# Patient Record
Sex: Male | Born: 2000 | Race: Black or African American | Hispanic: No | Marital: Single | Smoking: Never smoker
Health system: Southern US, Community
[De-identification: ages and names within clinical notes are randomized; demographics above are authoritative.]

---
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
## 2019-08-07 IMAGING — CR DG CHEST 1V
1 series · 1 of 1 positions shown · non-contrast
Comparison: None.

CLINICAL DATA: Positive PPD

EXAM:
CHEST 1 VIEW

[w chest pa]
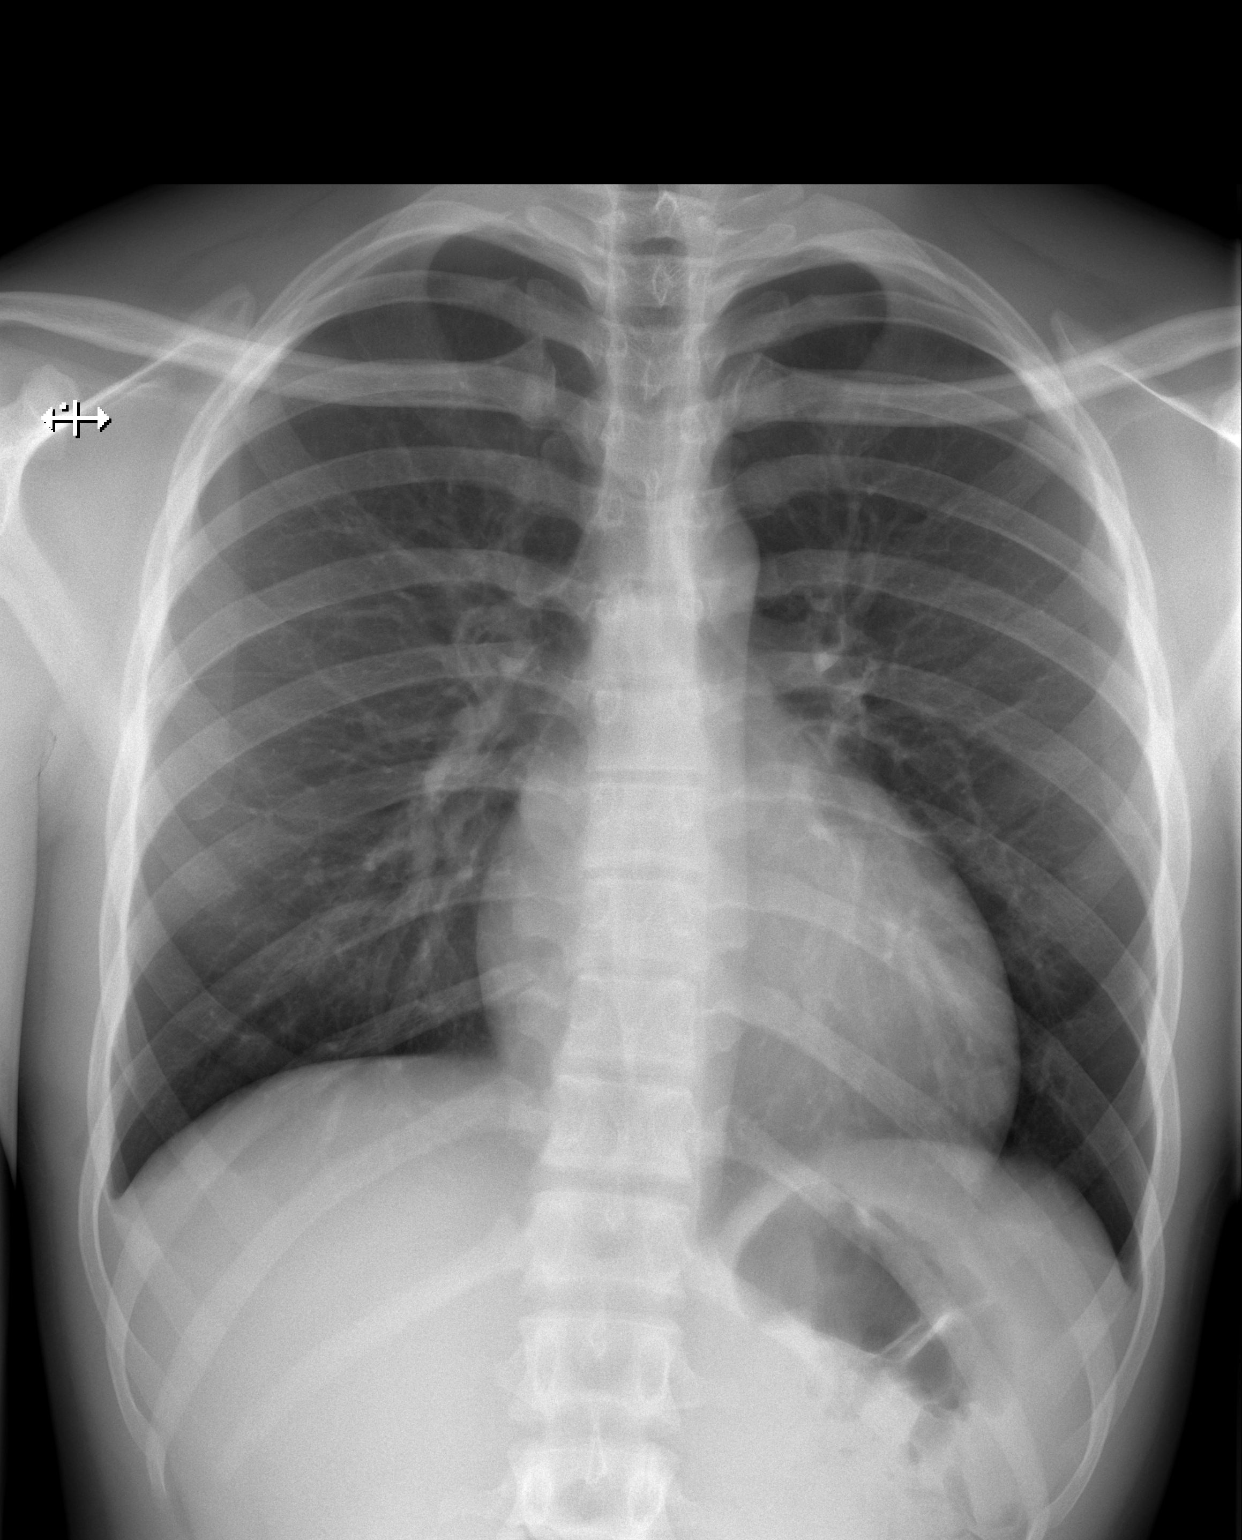

[1 of 1 positions shown; findings below may reference images not displayed]

FINDINGS: The heart size and mediastinal contours are within normal limits.
Both lungs are clear. The visualized skeletal structures are
unremarkable.
IMPRESSION: No active disease.

## 2020-05-06 ENCOUNTER — Ambulatory Visit: Payer: Managed Care, Other (non HMO) | Attending: Internal Medicine

## 2020-05-06 DIAGNOSIS — Z23 Encounter for immunization: Secondary | ICD-10-CM

## 2020-05-06 NOTE — Progress Notes (Signed)
° °  Covid-19 Vaccination Clinic  Name:  Keith Paul    MRN: [id number] DOB: 08-16-2001  05/06/2020  [name] was observed post Covid-19 immunization for 15 minutes without incident. He was provided with Vaccine Information Sheet and instruction to access the V-Safe system.   [name] was instructed to call 911 with any severe reactions post vaccine:  Difficulty breathing   Swelling of face and throat   A fast heartbeat   A bad rash all over body   Dizziness and weakness   Immunizations Administered    Name Date Dose VIS Date Route   Pfizer COVID-19 Vaccine 05/06/2020 11:52 AM 0.3 mL 01/26/2019 Intramuscular   Manufacturer: [employer], [employer]   Lot: [id number]   NDC: [address]-2

## 2020-05-27 ENCOUNTER — Ambulatory Visit: Payer: Managed Care, Other (non HMO) | Attending: Internal Medicine

## 2020-05-27 DIAGNOSIS — Z23 Encounter for immunization: Secondary | ICD-10-CM

## 2020-05-27 NOTE — Progress Notes (Signed)
   Covid-19 Vaccination Clinic  Name:  Merced Hanners    MRN: [id number] DOB: 01/03/2001  05/27/2020  Mr. Orvis was observed post Covid-19 immunization for 15 minutes without incident. He was provided with Vaccine Information Sheet and instruction to access the V-Safe system.   Mr. Revard was instructed to call 911 with any severe reactions post vaccine: Marland Kitchen Difficulty breathing  . Swelling of face and throat  . A fast heartbeat  . A bad rash all over body  . Dizziness and weakness   Immunizations Administered    Name Date Dose VIS Date Route   Pfizer COVID-19 Vaccine 05/27/2020  9:25 AM 0.3 mL 01/26/2019 Intramuscular   Manufacturer: [employer], [employer]   Lot: [id number]   NDC: [address]-7

## 2020-05-29 ENCOUNTER — Ambulatory Visit: Payer: Managed Care, Other (non HMO)

## 2021-08-10 ENCOUNTER — Other Ambulatory Visit: Payer: Self-pay

## 2021-08-10 ENCOUNTER — Ambulatory Visit
Admission: EM | Admit: 2021-08-10 | Discharge: 2021-08-10 | Disposition: A | Discharge status: Home / Self Care | Payer: Medicaid Other | Attending: Emergency Medicine | Admitting: Emergency Medicine

## 2021-08-10 DIAGNOSIS — H6692 Otitis media, unspecified, left ear: Secondary | ICD-10-CM

## 2021-08-10 MED ORDER — AMOXICILLIN 875 MG PO TABS: 875.0000 mg | ORAL_TABLET | Freq: Two times a day (BID) | ORAL | 0 refills | Status: AC

## 2021-08-10 NOTE — Discharge Instructions (Addendum)
Take the amoxicillin as directed.  Follow up with your primary care provider if your symptoms are not improving.   ° ° °

## 2021-08-10 NOTE — ED Triage Notes (Signed)
Pt presents with left ear pain for past 4 days

## 2021-08-10 NOTE — ED Provider Notes (Signed)
[facility]    CSN: [id number] Arrival date & time: 08/10/21  0813      History   Chief Complaint Chief Complaint  Patient presents with   Otalgia    HPI Keith Paul is a 20 y.o. male.  Patient presents with 4-day history of left ear pain.  He also reports muffled hearing.  Treatment attempted at home with OTC earwax kit without relief; ibuprofen taken yesterday.  He denies fever, chills, ear drainage, sore throat, cough, or other symptoms.  His medical history includes malaria, acute renal failure, thrombocytopenia.  The history is provided by the patient and medical records.   History reviewed. No pertinent past medical history.  Patient Active Problem List   Diagnosis Date Noted   Malaria 06/26/2018   ARF (acute renal failure) ([facility]) 06/26/2018   Thrombocytopenia ([facility]) 06/26/2018    History reviewed. No pertinent surgical history.     Home Medications    Prior to Admission medications   Medication Sig Start Date End Date Taking? Authorizing Provider  amoxicillin (AMOXIL) 875 MG tablet Take 1 tablet (875 mg total) by mouth 2 (two) times daily for 7 days. 08/10/21 08/17/21 Yes Sharion Balloon, NP  ibuprofen (ADVIL,MOTRIN) 200 MG tablet Take 400 mg by mouth every 6 (six) hours as needed for headache.    [provider]    Family History Family History  Problem Relation Age of Onset   Hypertension Father     Social History Social History   Tobacco Use   Smoking status: Never   Smokeless tobacco: Never  Substance Use Topics   Alcohol use: Never   Drug use: Never     Allergies   Patient has no known allergies.   Review of Systems Review of Systems  Constitutional:  Negative for chills and fever.  HENT:  Positive for ear pain and hearing loss. Negative for ear discharge and sore throat.   Respiratory:  Negative for cough and shortness of breath.   Cardiovascular:  Negative for chest pain and palpitations.  Gastrointestinal:  Negative  for abdominal pain and vomiting.  Skin:  Negative for color change and rash.  Neurological:  Negative for syncope.  All other systems reviewed and are negative.   Physical Exam Triage Vital Signs ED Triage Vitals  Enc Vitals Group     BP      Pulse      Resp      Temp      Temp src      SpO2      Weight      Height      Head Circumference      Peak Flow      Pain Score      Pain Loc      Pain Edu?      Excl. in [place]?    No data found.  Updated Vital Signs BP 120/80   Pulse 64   Temp 98.6 F (37 C)   Resp 16   SpO2 95%   Visual Acuity Right Eye Distance:   Left Eye Distance:   Bilateral Distance:    Right Eye Near:   Left Eye Near:    Bilateral Near:     Physical Exam Vitals and nursing note reviewed.  Constitutional:      General: He is not in acute distress.    Appearance: He is well-developed.  HENT:     Head: Normocephalic and atraumatic.     Right Ear: Tympanic  membrane and ear canal normal.     Left Ear: Tympanic membrane is erythematous.     Nose: Nose normal.     Mouth/Throat:     Mouth: Mucous membranes are moist.     Pharynx: Oropharynx is clear.  Eyes:     Conjunctiva/sclera: Conjunctivae normal.  Cardiovascular:     Rate and Rhythm: Normal rate and regular rhythm.     Heart sounds: Normal heart sounds.  Pulmonary:     Effort: Pulmonary effort is normal. No respiratory distress.     Breath sounds: Normal breath sounds.  Abdominal:     Palpations: Abdomen is soft.     Tenderness: There is no abdominal tenderness.  Musculoskeletal:     Cervical back: Neck supple.  Skin:    General: Skin is warm and dry.  Neurological:     General: No focal deficit present.     Mental Status: He is alert and oriented to person, place, and time.     Gait: Gait normal.  Psychiatric:        Mood and Affect: Mood normal.        Behavior: Behavior normal.     UC Treatments / Results  Labs (all labs ordered are listed, but only abnormal results are  displayed) Labs Reviewed - No data to display  EKG   Radiology No results found.  Procedures Procedures (including critical care time)  Medications Ordered in UC Medications - No data to display  Initial Impression / Assessment and Plan / UC Course  I have reviewed the triage vital signs and the nursing notes.  Pertinent labs & imaging results that were available during my care of the patient were reviewed by me and considered in my medical decision making (see chart for details).  Otitis media.  Treating with amoxicillin.  Discussed symptomatic relief including Tylenol or ibuprofen.  Education provided on otitis media.  Instructed patient to follow-up with his PCP if his symptoms are not improving.     Final Clinical Impressions(s) / UC Diagnoses   Final diagnoses:  Left otitis media, unspecified otitis media type     Discharge Instructions      Take the amoxicillin as directed.    Follow up with your primary care provider if your symptoms are not improving.         ED Prescriptions     Medication Sig Dispense Auth. Provider   amoxicillin (AMOXIL) 875 MG tablet Take 1 tablet (875 mg total) by mouth 2 (two) times daily for 7 days. 14 tablet Sharion Balloon, NP      PDMP not reviewed this encounter.   Sharion Balloon, NP 08/10/21 [phone]

## 2022-07-21 ENCOUNTER — Ambulatory Visit
Admission: EM | Admit: 2022-07-21 | Discharge: 2022-07-21 | Disposition: A | Discharge status: Home / Self Care | Payer: BC Managed Care – PPO

## 2022-07-21 DIAGNOSIS — R030 Elevated blood-pressure reading, without diagnosis of hypertension: Secondary | ICD-10-CM | POA: Diagnosis not present

## 2022-07-21 NOTE — ED Provider Notes (Signed)
Renal[facility] Fiddler    CSN: [id number] Arrival date & time: 07/21/22  1312      History   Chief Complaint Chief Complaint  Patient presents with   Hypertension    HPI Keith Paul is a 21 y.o. male.   Patient presents for evaluation for hypertension.  Endorses that he took a reading at home where his systolic blood pressure was in the 160s, endorses this was taken in a calm quiet environment.  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  Familial history of hypertension.  Denies dizziness, lightheadedness, chest pain or tightness, shortness of breath, visual disturbance.  Believes that he is making meals throughout the day do contain salt however he is not the cook.  Endorses that he does play recreational volleyball twice weekly as his method of working out.  History reviewed. No pertinent past medical history.  Patient Active Problem List   Diagnosis Date Noted   Malaria 06/26/2018   ARF (acute renal failure) ([facility]) 06/26/2018   Thrombocytopenia ([facility]) 06/26/2018    History reviewed. No pertinent surgical history.     Home Medications    Prior to Admission medications   Medication Sig Start Date End Date Taking? Authorizing Provider  ibuprofen (ADVIL,MOTRIN) 200 MG tablet Take 400 mg by mouth every 6 (six) hours as needed for headache.    [provider]    Family History Family History  Problem Relation Age of Onset   Hypertension Father     Social History Social History   Tobacco Use   Smoking status: Never   Smokeless tobacco: Never  Substance Use Topics   Alcohol use: Never   Drug use: Never     Allergies   Patient has no known allergies.   Review of Systems Review of Systems  Constitutional: Negative.   HENT: Negative.    Respiratory: Negative.    Cardiovascular: Negative.   Skin: Negative.   Neurological:  Negative.      Physical Exam Triage Vital Signs ED Triage Vitals  Enc Vitals Group     BP 07/21/22 1341 (!) 144/71     Pulse Rate 07/21/22 1341 (!) 58     Resp 07/21/22 1341 16     Temp 07/21/22 1341 98.1 F (36.7 C)     Temp Source 07/21/22 1341 Temporal     SpO2 07/21/22 1341 98 %     Weight --      Height --      Head Circumference --      Peak Flow --      Pain Score 07/21/22 1340 0     Pain Loc --      Pain Edu? --      Excl. in [place]? --    No data found.  Updated Vital Signs BP (!) 144/71 (BP Location: Left Arm)   Pulse (!) 58   Temp 98.1 F (36.7 C) (Temporal)   Resp 16   SpO2 98%   Visual Acuity Right Eye Distance:   Left Eye Distance:   Bilateral Distance:    Right Eye Near:   Left Eye Near:    Bilateral Near:     Physical Exam Vitals and nursing note reviewed.  Constitutional:      General: He is not in acute distress.    Appearance: Normal appearance. He is well-developed.  HENT:  Head: Normocephalic.  Eyes:     Extraocular Movements: Extraocular movements intact.  Cardiovascular:     Rate and Rhythm: Normal rate and regular rhythm.     Pulses: Normal pulses.     Heart sounds: Normal heart sounds. No murmur heard. Pulmonary:     Effort: Pulmonary effort is normal. No respiratory distress.     Breath sounds: Normal breath sounds.  Abdominal:     Palpations: Abdomen is soft.     Tenderness: There is no abdominal tenderness.  Skin:    General: Skin is warm and dry.  Neurological:     Mental Status: He is alert and oriented to person, place, and time.  Psychiatric:        Mood and Affect: Mood normal.      UC Treatments / Results  Labs (all labs ordered are listed, but only abnormal results are displayed) Labs Reviewed - No data to display  EKG   Radiology No results found.  Procedures Procedures (including critical care time)  Medications Ordered in UC Medications - No data to display  Initial Impression / Assessment  and Plan / UC Course  I have reviewed the triage vital signs and the nursing notes.  Pertinent labs & imaging results that were available during my care of the patient were reviewed by me and considered in my medical decision making (see chart for details).  Elevated blood pressure reading without diagnosis of hypertension  Blood pressure 144/71 in triage, nonemergent, signs of hypertensive urgency, discussed with patient, as patient is a young healthy adult with no medical history recommended lifestyle changes before proceeding with initiation of antihypertensives, discussed dietary changes as well as physical activity recommendations and given written handout, advised patient to take blood pressure 1-2 times weekly and report, given information on how to schedule a primary care appointment and strongly advised follow-up in 2 months Final Clinical Impressions(s) / UC Diagnoses   Final diagnoses:  Elevated blood pressure reading without diagnosis of hypertension     Discharge Instructions      Today in office your blood pressure was 144/71, 144 is elevated however it is not an emergent levels  As you are young healthy adult I would like you to attempt to make lifestyle changes before we attempt to start any medications as this will be a lifelong male  Begin exercises that focus on cardio which helped to work out your heart, you may start by just walking 30 minutes 3 times a week, you will need to walk at a pace that causes your heart to beat faster and causes you effort when talking  Decrease the amount of salt within your diet as salt naturally increases the blood pressure  You may check your blood pressure twice weekly and record and take to primary appointment for further evaluation, always make sure that you are calm and in a quiet environment when measuring for a more accurate reading, also make sure that you are using a brachial arm blood pressure cuff as this is more accurate than  the wrist  Please make an appointment with a primary doctor for further evaluation   ED Prescriptions   None    PDMP not reviewed this encounter.   Valinda Hoar, NP 07/21/22 1431

## 2022-07-21 NOTE — Discharge Instructions (Signed)
Today in office your blood pressure was 144/71, 144 is elevated however it is not an emergent levels  As you are young healthy adult I would like you to attempt to make lifestyle changes before we attempt to start any medications as this will be a lifelong male  Begin exercises that focus on cardio which helped to work out your heart, you may start by just walking 30 minutes 3 times a week, you will need to walk at a pace that causes your heart to beat faster and causes you effort when talking  Decrease the amount of salt within your diet as salt naturally increases the blood pressure  You may check your blood pressure twice weekly and record and take to primary appointment for further evaluation, always make sure that you are calm and in a quiet environment when measuring for a more accurate reading, also make sure that you are using a brachial arm blood pressure cuff as this is more accurate than the wrist  Please make an appointment with a primary doctor for further evaluation

## 2022-07-21 NOTE — ED Triage Notes (Signed)
Patient presents to Urgent Care with complaints of elevated BP. He states on two separate occasions he has been told his BP has been high. Yesterday he checked it with a cuff and it was in the 160's. Denies any symptoms. Family hx of HTN.
